# Patient Record
Sex: Female | Born: 1985 | Race: White | Hispanic: No | Marital: Single | Smoking: Never smoker
Health system: Southern US, Community
[De-identification: ages and names within clinical notes are randomized; demographics above are authoritative.]

## PROBLEM LIST (undated history)

## (undated) HISTORY — PX: TONSILLECTOMY: SUR1361

## (undated) HISTORY — PX: OTHER SURGICAL HISTORY: SHX169

---

## 2011-12-09 ENCOUNTER — Emergency Department (HOSPITAL_COMMUNITY)
Admission: EM | Admit: 2011-12-09 | Discharge: 2011-12-09 | Disposition: A | Discharge status: Home / Self Care | Payer: BC Managed Care – PPO | Attending: Emergency Medicine | Admitting: Emergency Medicine

## 2011-12-09 ENCOUNTER — Encounter (HOSPITAL_COMMUNITY): Payer: Self-pay | Admitting: Emergency Medicine

## 2011-12-09 DIAGNOSIS — X58XXXA Exposure to other specified factors, initial encounter: Secondary | ICD-10-CM | POA: Insufficient documentation

## 2011-12-09 DIAGNOSIS — T7840XA Allergy, unspecified, initial encounter: Secondary | ICD-10-CM | POA: Insufficient documentation

## 2011-12-09 DIAGNOSIS — R21 Rash and other nonspecific skin eruption: Secondary | ICD-10-CM | POA: Insufficient documentation

## 2011-12-09 MED ORDER — DIPHENHYDRAMINE HCL 50 MG/ML IJ SOLN
25.0000 mg | Freq: Once | INTRAMUSCULAR | Status: AC
  Administered 2011-12-09: 25 mg via INTRAVENOUS
  Filled 2011-12-09: qty 1

## 2011-12-09 MED ORDER — EPINEPHRINE 0.3 MG/0.3ML IJ DEVI: 0.3000 mg | Freq: Once | INTRAMUSCULAR | Status: DC

## 2011-12-09 MED ORDER — METHYLPREDNISOLONE SODIUM SUCC 125 MG IJ SOLR
125.0000 mg | Freq: Once | INTRAMUSCULAR | Status: AC
  Administered 2011-12-09: 125 mg via INTRAVENOUS
  Filled 2011-12-09: qty 2

## 2011-12-09 MED ORDER — FAMOTIDINE 40 MG PO TABS: 40.0000 mg | ORAL_TABLET | Freq: Two times a day (BID) | ORAL | Status: DC | PRN

## 2011-12-09 MED ORDER — DIPHENHYDRAMINE HCL 25 MG PO TABS: 25.0000 mg | ORAL_TABLET | Freq: Four times a day (QID) | ORAL | Status: DC

## 2011-12-09 MED ORDER — FAMOTIDINE IN NACL 20-0.9 MG/50ML-% IV SOLN
20.0000 mg | Freq: Once | INTRAVENOUS | Status: AC
  Administered 2011-12-09: 20 mg via INTRAVENOUS
  Filled 2011-12-09: qty 50

## 2011-12-09 MED ORDER — MUPIROCIN 2 % EX OINT: TOPICAL_OINTMENT | Freq: Two times a day (BID) | CUTANEOUS | Status: AC

## 2011-12-09 MED ORDER — SODIUM CHLORIDE 0.9 % IV BOLUS (SEPSIS)
1000.0000 mL | Freq: Once | INTRAVENOUS | Status: AC
  Administered 2011-12-09: 1000 mL via INTRAVENOUS

## 2011-12-09 MED ORDER — ALBUTEROL SULFATE (5 MG/ML) 0.5% IN NEBU
5.0000 mg | INHALATION_SOLUTION | Freq: Once | RESPIRATORY_TRACT | Status: AC
  Administered 2011-12-09: 5 mg via RESPIRATORY_TRACT
  Filled 2011-12-09: qty 1

## 2011-12-09 MED ORDER — PREDNISONE (PAK) 10 MG PO TABS: 10.0000 mg | ORAL_TABLET | Freq: Every day | ORAL | Status: AC

## 2011-12-09 NOTE — Discharge Instructions (Signed)
Allergic Reaction  Allergic reactions can be caused by anything your body is sensitive to. Your body may be sensitive to food, medicines, molds, pollens, cockroaches, dust mites, pets, insect stings, and other things around you. An allergic reaction may cause puffiness (swelling), itching, sneezing, coughing, or problems breathing.   Allergies cannot be cured, but they can be controlled with medicine. Some allergies happen only at certain times of the year. Try to stay away from what causes your reaction if possible. Sometimes, it is hard to tell what causes your reaction.  HOME CARE  If you have a rash or red patches (hives) on your skin:   Take medicines as told by your doctor.   Do not drive or drink alcohol after taking medicines. They can make you sleepy.   Put cold cloths on your skin. Take baths in cool water. This will help your itching. Do not take hot baths or showers. Heat will make the itching worse.   If your allergies get worse, your doctor might give you other medicines. Talk to your doctor if problems continue.  GET HELP RIGHT AWAY IF:    You have trouble breathing.   You have a tight feeling in your chest or throat.   Your mouth gets puffy (swollen).   You have red, itchy patches on your skin (hives) that get worse.   You have itching all over your body.  MAKE SURE YOU:    Understand these instructions.   Will watch your condition.   Will get help right away if you are not doing well or get worse.  Document Released: 07/17/2009 Document Revised: 07/18/2011 Document Reviewed: 07/17/2009  Exit[employer] Patient Information 2012 [employer].

## 2011-12-09 NOTE — ED Notes (Signed)
Pt states she went swimming in a pool at an apartment complex and when she got out she noticed she had red bumps all over and was itching  Pt states she took 4 benadryl and came to the hospital  Pt has circular places noted on her chest, shoulders, arms and on her upper legs  Pt states they itch  Pt states she was having difficulty breathing but the benadryl has helped with that

## 2011-12-09 NOTE — ED Provider Notes (Signed)
History     CSN: [id number]  Arrival date & time 12/09/11  1911   First MD Initiated Contact with Patient 12/09/11 2039      Chief Complaint  Patient presents with  . Allergic Reaction    (Consider location/radiation/quality/duration/timing/severity/associated sxs/prior treatment) HPI  26 year old female presents with chief complaints of allergic reaction. Patient was swimming in pool at her apartment complex, and when she got out she noticed red bumps all over herself.  She proceed to take 4 benadryl.  She also were having difficulty breathing and decided to come to ER.  Sts she has swim in same pool in the past without difficulty.  Denies fever, chills, medication changes, throat swelling, or abd pain.  Denies CP.  Does complain of nausea.  Denies new pets.    History reviewed. No pertinent past medical history.  Past Surgical History  Procedure Date  . Tonsillectomy   . Adnoidectomy      History reviewed. No pertinent family history.  History  Substance Use Topics  . Smoking status: Never Smoker   . Smokeless tobacco: Not on file  . Alcohol Use: No    OB History    Grav Para Term Preterm Abortions TAB SAB Ect Mult Living                  Review of Systems  All other systems reviewed and are negative.    Allergies  Augmentin and Ceclor  Home Medications   Current Outpatient Rx  Name Route Sig Dispense Refill  . DIPHENHYDRAMINE HCL 25 MG PO TABS Oral Take 50 mg by mouth 2 (two) times daily. Itching.      BP 125/66  Pulse 118  Temp(Src) 98.4 F (36.9 C) (Oral)  Resp 16  SpO2 100%  LMP 12/07/2011  Physical Exam  Nursing note and vitals reviewed. Constitutional: She appears well-developed and well-nourished. No distress.       Awake, alert, nontoxic appearance  HENT:  Head: Atraumatic.  Eyes: Conjunctivae are normal. Right eye exhibits no discharge. Left eye exhibits no discharge.  Neck: Neck supple.  Cardiovascular: Normal rate and regular  rhythm.   Pulmonary/Chest: Effort normal. No respiratory distress. She exhibits no tenderness.  Abdominal: Soft. There is no tenderness. There is no rebound.  Musculoskeletal: She exhibits no tenderness.       ROM appears intact, no obvious focal weakness  Neurological:       Mental status and motor strength appears intact  Skin: No rash noted.       0.5-1 cm circular rashes with central clearing noted on chest, arms, shoulders, back, thigh.  Non pustular, non petechiae.    Psychiatric: She has a normal mood and affect.    ED Course  Procedures (including critical care time)  Labs Reviewed - No data to display No results found.   No diagnosis found.    MDM  Pt with allergic rxn after swimming in pool.  However, her rash is suggestive of tinea corporis and not likely hives.  Pt in no acute resp distress, however does complain of intense itch. Sts benadryl has helped.    Plan to give NS, benadryl/pepcid/solumedrol/and albuterol nebs for treatment.     9:56 PM Pt felt better after treatment.  Will DC with prednisone/pepcid/benadryl.  Pt has epipen at home.  Due to her rash,will prescribe bactroban to use to prevent secondary bacterial infection.  Recommend f/u if worsen.  Pt voice understanding and agrees with plan.  Fayrene Helper, PA-C 12/09/11 2201

## 2011-12-10 NOTE — ED Provider Notes (Signed)
Medical screening examination/treatment/procedure(s) were performed by non-physician practitioner and as supervising physician I was immediately available for consultation/collaboration.   Ereka Brau, MD 12/10/11 0013 

## 2012-02-21 ENCOUNTER — Other Ambulatory Visit: Payer: Self-pay | Admitting: Family Medicine

## 2012-02-21 DIAGNOSIS — M545 Low back pain: Secondary | ICD-10-CM

## 2012-02-25 ENCOUNTER — Ambulatory Visit
Admission: RE | Admit: 2012-02-25 | Discharge: 2012-02-25 | Disposition: A | Discharge status: Home / Self Care | Payer: BC Managed Care – PPO | Source: Ambulatory Visit | Attending: Family Medicine | Admitting: Family Medicine

## 2012-02-25 DIAGNOSIS — M545 Low back pain: Secondary | ICD-10-CM

## 2013-06-14 ENCOUNTER — Emergency Department (HOSPITAL_COMMUNITY)
Admission: EM | Admit: 2013-06-14 | Discharge: 2013-06-14 | Disposition: A | Discharge status: Home / Self Care | Payer: BC Managed Care – PPO | Attending: Emergency Medicine | Admitting: Emergency Medicine

## 2013-06-14 DIAGNOSIS — R509 Fever, unspecified: Secondary | ICD-10-CM | POA: Diagnosis not present

## 2013-06-14 DIAGNOSIS — IMO0002 Reserved for concepts with insufficient information to code with codable children: Secondary | ICD-10-CM

## 2013-06-14 DIAGNOSIS — L0231 Cutaneous abscess of buttock: Secondary | ICD-10-CM | POA: Insufficient documentation

## 2013-06-14 DIAGNOSIS — R1110 Vomiting, unspecified: Secondary | ICD-10-CM | POA: Diagnosis not present

## 2013-06-14 MED ORDER — PROMETHAZINE HCL 25 MG PO TABS: 25.0000 mg | ORAL_TABLET | Freq: Four times a day (QID) | ORAL | Status: DC | PRN

## 2013-06-14 MED ORDER — KETOROLAC TROMETHAMINE 60 MG/2ML IM SOLN
30.0000 mg | Freq: Once | INTRAMUSCULAR | Status: AC
  Administered 2013-06-14: 30 mg via INTRAMUSCULAR
  Filled 2013-06-14: qty 2

## 2013-06-14 MED ORDER — OXYCODONE-ACETAMINOPHEN 5-325 MG PO TABS: ORAL_TABLET | ORAL | Status: DC

## 2013-06-14 MED ORDER — SULFAMETHOXAZOLE-TMP DS 800-160 MG PO TABS
1.0000 | ORAL_TABLET | Freq: Once | ORAL | Status: AC
  Administered 2013-06-14: 1 via ORAL
  Filled 2013-06-14: qty 1

## 2013-06-14 MED ORDER — SULFAMETHOXAZOLE-TRIMETHOPRIM 800-160 MG PO TABS: 1.0000 | ORAL_TABLET | Freq: Two times a day (BID) | ORAL | Status: DC

## 2013-06-14 MED ORDER — ONDANSETRON 4 MG PO TBDP
4.0000 mg | ORAL_TABLET | Freq: Once | ORAL | Status: AC
  Administered 2013-06-14: 4 mg via ORAL
  Filled 2013-06-14: qty 1

## 2013-06-14 NOTE — ED Provider Notes (Signed)
CSN: [id number]     Arrival date & time 06/14/13  1755 History   First MD Initiated Contact with Patient 06/14/13 2015     Chief Complaint  Patient presents with  . Skin Abcess   . Emesis   (Consider location/radiation/quality/duration/timing/severity/associated sxs/prior Treatment) HPI  Diane House is a 27 y.o. female complaining of abscess to right gluteus is actively draining blood and pus. Patient noticed a swelling approximately 3 days ago. She has had 5 episodes of emesis today. She also reports subjective fever and chills. Patient took ibuprofen at 4:30 PM. Patient denies prior history of abscess.  No past medical history on file. Past Surgical History  Procedure Laterality Date  . Tonsillectomy    . Adnoidectomy      No family history on file. History  Substance Use Topics  . Smoking status: Never Smoker   . Smokeless tobacco: Not on file  . Alcohol Use: No   OB History   Grav Para Term Preterm Abortions TAB SAB Ect Mult Living                 Review of Systems 10 systems reviewed and found to be negative, except as noted in the HPI   Allergies  Augmentin and Ceclor  Home Medications   Current Outpatient Rx  Name  Route  Sig  Dispense  Refill  . ibuprofen (ADVIL,MOTRIN) 200 MG tablet   Oral   Take 400 mg by mouth every 6 (six) hours as needed for pain.         Marland Kitchen oxyCODONE-acetaminophen (PERCOCET/ROXICET) 5-325 MG per tablet      1 to 2 tabs PO q6hrs  PRN for pain   15 tablet   0   . promethazine (PHENERGAN) 25 MG tablet   Oral   Take 1 tablet (25 mg total) by mouth every 6 (six) hours as needed for nausea.   12 tablet   0   . sulfamethoxazole-trimethoprim (SEPTRA DS) 800-160 MG per tablet   Oral   Take 1 tablet by mouth every 12 (twelve) hours.   14 tablet   0    BP 139/84  Pulse 91  Temp(Src) 99 F (37.2 C) (Oral)  SpO2 99%  LMP 05/29/2013 Physical Exam  Nursing note and vitals reviewed. Constitutional: She is oriented to person,  place, and time. She appears well-developed and well-nourished. No distress.  HENT:  Head: Normocephalic.  Eyes: Conjunctivae and EOM are normal.  Cardiovascular: Normal rate.   Pulmonary/Chest: Effort normal. No stridor.  Musculoskeletal: Normal range of motion.  Neurological: She is alert and oriented to person, place, and time.  Skin:     Psychiatric: She has a normal mood and affect.    ED Course  Procedures (including critical care time)  INCISION AND DRAINAGE Performed by: Wynetta Emery Consent: Verbal consent obtained. Risks and benefits: risks, benefits and alternatives were discussed Type: abscess  Body area: Right gluteus  Anesthesia: local infiltration  Incision was made with a scalpel.  Local anesthetic: lidocaine 2% with epinephrine  Anesthetic total: 3 ml  Complexity: complex Blunt dissection to break up loculations  Drainage: purulent  Drainage amount: 5ml  Packing material: none  Patient tolerance: Patient tolerated the procedure well with no immediate complications.   Labs Review Labs Reviewed - No data to display Imaging Review No results found.  EKG Interpretation   None       MDM   1. Abscess of cellulitis of buttock    Filed Vitals:  06/14/13 1818 06/14/13 2129  BP: 139/84 120/83  Pulse: 91 91  Temp: 99 F (37.2 C) 98 F (36.7 C)  TempSrc: Oral Oral  Resp:  16  SpO2: 99% 98%     Diane House is a 27 y.o. female with right gluteal abscess, and surrounding cellulitis. Abscess is approximately a 6 cm away from the anal verge it off think there is danger of connecting track to the rectum. I&D yielded a scant amount of purulent material. I think is reasonable start her on antibiotics because of the surrounding cellulitis and small amount of purulent drainage. Patient will be advised to perform sitz baths. Extensive discussion of return precautions in light of subjective fever and chills and vomiting.   Medications    ketorolac (TORADOL) injection 30 mg (30 mg Intramuscular Given 06/14/13 2043)  ondansetron (ZOFRAN-ODT) disintegrating tablet 4 mg (4 mg Oral Given 06/14/13 2044)  sulfamethoxazole-trimethoprim (BACTRIM DS) 800-160 MG per tablet 1 tablet (1 tablet Oral Given 06/14/13 2126)    Pt is hemodynamically stable, appropriate for, and amenable to discharge at this time. Pt verbalized understanding and agrees with care plan. All questions answered. Outpatient follow-up and specific return precautions discussed.    Discharge Medication List as of 06/14/2013  9:09 PM    START taking these medications   Details  oxyCODONE-acetaminophen (PERCOCET/ROXICET) 5-325 MG per tablet 1 to 2 tabs PO q6hrs  PRN for pain, Print    promethazine (PHENERGAN) 25 MG tablet Take 1 tablet (25 mg total) by mouth every 6 (six) hours as needed for nausea., Starting 06/14/2013, Until Discontinued, Print    sulfamethoxazole-trimethoprim (SEPTRA DS) 800-160 MG per tablet Take 1 tablet by mouth every 12 (twelve) hours., Starting 06/14/2013, Until Discontinued, Print        Note: Portions of this report may have been transcribed using voice recognition software. Every effort was made to ensure accuracy; however, inadvertent computerized transcription errors may be present      Wynetta Emery, PA-C 06/15/13 0045

## 2013-06-14 NOTE — ED Notes (Addendum)
Pt states she has what seems to be an abcess on her R buttock that ruptured today and is now draining. States she has been vomiting and has been hot and cold . Took ibuprofen at 4:30pm.

## 2013-06-15 NOTE — ED Provider Notes (Signed)
Medical screening examination/treatment/procedure(s) were performed by non-physician practitioner and as supervising physician I was immediately available for consultation/collaboration.   Derwood Kaplan, MD 06/15/13 [phone]

## 2013-10-25 ENCOUNTER — Encounter (HOSPITAL_COMMUNITY): Payer: Self-pay | Admitting: Emergency Medicine

## 2013-10-25 ENCOUNTER — Emergency Department (HOSPITAL_COMMUNITY)
Admission: EM | Admit: 2013-10-25 | Discharge: 2013-10-25 | Disposition: A | Discharge status: Home / Self Care | Payer: BC Managed Care – PPO | Attending: Emergency Medicine | Admitting: Emergency Medicine

## 2013-10-25 DIAGNOSIS — Z792 Long term (current) use of antibiotics: Secondary | ICD-10-CM | POA: Insufficient documentation

## 2013-10-25 DIAGNOSIS — R21 Rash and other nonspecific skin eruption: Secondary | ICD-10-CM | POA: Insufficient documentation

## 2013-10-25 DIAGNOSIS — Z880 Allergy status to penicillin: Secondary | ICD-10-CM | POA: Insufficient documentation

## 2013-10-25 DIAGNOSIS — L02415 Cutaneous abscess of right lower limb: Secondary | ICD-10-CM

## 2013-10-25 DIAGNOSIS — L02416 Cutaneous abscess of left lower limb: Secondary | ICD-10-CM

## 2013-10-25 DIAGNOSIS — L02419 Cutaneous abscess of limb, unspecified: Secondary | ICD-10-CM | POA: Insufficient documentation

## 2013-10-25 DIAGNOSIS — L03119 Cellulitis of unspecified part of limb: Principal | ICD-10-CM

## 2013-10-25 DIAGNOSIS — Z79899 Other long term (current) drug therapy: Secondary | ICD-10-CM | POA: Insufficient documentation

## 2013-10-25 MED ORDER — CLINDAMYCIN HCL 150 MG PO CAPS: 150.0000 mg | ORAL_CAPSULE | Freq: Two times a day (BID) | ORAL | Status: AC

## 2013-10-25 NOTE — Discharge Instructions (Signed)
 Abscess Care After An abscess (also called a boil or furuncle) is an infected area that contains a collection of pus. Signs and symptoms of an abscess include pain, tenderness, redness, or hardness, or you may feel a moveable soft area under your skin. An abscess can occur anywhere in the body. The infection may spread to surrounding tissues causing cellulitis. A cut (incision) by the surgeon was made over your abscess and the pus was drained out. Gauze may have been packed into the space to provide a drain that will allow the cavity to heal from the inside outwards. The boil may be painful for 5 to 7 days. Most people with a boil do not have high fevers. Your abscess, if seen early, may not have localized, and may not have been lanced. If not, another appointment may be required for this if it does not get better on its own or with medications. HOME CARE INSTRUCTIONS   Only take over-the-counter or prescription medicines for pain, discomfort, or fever as directed by your caregiver.  When you bathe, soak and then remove gauze or iodoform packs at least daily or as directed by your caregiver. You may then wash the wound gently with mild soapy water. Repack with gauze or do as your caregiver directs. SEEK IMMEDIATE MEDICAL CARE IF:   You develop increased pain, swelling, redness, drainage, or bleeding in the wound site.  You develop signs of generalized infection including muscle aches, chills, fever, or a general ill feeling.  An oral temperature above 102 F (38.9 C) develops, not controlled by medication. See your caregiver for a recheck if you develop any of the symptoms described above. If medications (antibiotics) were prescribed, take them as directed. Document Released: 02/14/2005 Document Revised: 10/21/2011 Document Reviewed: 10/12/2007 Exit[employer] Patient Information 2014 [employer].   Abscess An abscess is an infected area that contains a collection of pus and debris.It can  occur in almost any part of the body. An abscess is also known as a furuncle or boil. CAUSES  An abscess occurs when tissue gets infected. This can occur from blockage of oil or sweat glands, infection of hair follicles, or a minor injury to the skin. As the body tries to fight the infection, pus collects in the area and creates pressure under the skin. This pressure causes pain. People with weakened immune systems have difficulty fighting infections and get certain abscesses more often.  SYMPTOMS Usually an abscess develops on the skin and becomes a painful mass that is red, warm, and tender. If the abscess forms under the skin, you may feel a moveable soft area under the skin. Some abscesses break open (rupture) on their own, but most will continue to get worse without care. The infection can spread deeper into the body and eventually into the bloodstream, causing you to feel ill.  DIAGNOSIS  Your caregiver will take your medical history and perform a physical exam. A sample of fluid may also be taken from the abscess to determine what is causing your infection. TREATMENT  Your caregiver may prescribe antibiotic medicines to fight the infection. However, taking antibiotics alone usually does not cure an abscess. Your caregiver may need to make a small cut (incision) in the abscess to drain the pus. In some cases, gauze is packed into the abscess to reduce pain and to continue draining the area. HOME CARE INSTRUCTIONS   Only take over-the-counter or prescription medicines for pain, discomfort, or fever as directed by your caregiver.  If   you were prescribed antibiotics, take them as directed. Finish them even if you start to feel better.  If gauze is used, follow your caregiver's directions for changing the gauze.  To avoid spreading the infection:  Keep your draining abscess covered with a bandage.  Wash your hands well.  Do not share personal care items, towels, or whirlpools with  others.  Avoid skin contact with others.  Keep your skin and clothes clean around the abscess.  Keep all follow-up appointments as directed by your caregiver. SEEK MEDICAL CARE IF:   You have increased pain, swelling, redness, fluid drainage, or bleeding.  You have muscle aches, chills, or a general ill feeling.  You have a fever. MAKE SURE YOU:   Understand these instructions.  Will watch your condition.  Will get help right away if you are not doing well or get worse. Document Released: 05/08/2005 Document Revised: 01/28/2012 Document Reviewed: 10/11/2011 [employer] Patient Information 2014 [employer].  

## 2013-10-25 NOTE — ED Provider Notes (Signed)
CSN: [id number]     Arrival date & time 10/25/13  1412 History   First MD Initiated Contact with Patient 10/25/13 1913     Chief Complaint  Patient presents with  . Abscess     (Consider location/radiation/quality/duration/timing/severity/associated sxs/prior Treatment) HPI   Patient to the ER with complaints of multiple abscesses. These abscesses started 1 month ago to left upper leg after she sustained a laceration. Since then she has been given 1 round of abx but the abscesses continue to come and she is allergic to Augmentin and Amoxicillin. She drained abscesses herself at home before coming to the ED because she did not want [place] to cut into them and leave more scars. The patient is crying and distressed because she is upset about scaring and that for the past month the abscesses just keep proliferating.   History reviewed. No pertinent past medical history. Past Surgical History  Procedure Laterality Date  . Tonsillectomy    . Adnoidectomy      No family history on file. History  Substance Use Topics  . Smoking status: Never Smoker   . Smokeless tobacco: Not on file  . Alcohol Use: No   OB History   Grav Para Term Preterm Abortions TAB SAB Ect Mult Living                 Review of Systems  The patient denies anorexia, fever, weight loss,, vision loss, decreased hearing, hoarseness, chest pain, syncope, dyspnea on exertion, peripheral edema, balance deficits, hemoptysis, abdominal pain, melena, hematochezia, severe indigestion/heartburn, hematuria, incontinence, genital sores, muscle weakness, suspicious skin lesions, transient blindness, difficulty walking, depression, unusual weight change, abnormal bleeding, enlarged lymph nodes, angioedema, and breast masses.    Allergies  Amoxicillin; Augmentin; and Ceclor  Home Medications   Current Outpatient Rx  Name  Route  Sig  Dispense  Refill  . ranitidine (ZANTAC) 150 MG tablet   Oral   Take 150 mg by mouth 2 (two)  times daily.         . clindamycin (CLEOCIN) 150 MG capsule   Oral   Take 1 capsule (150 mg total) by mouth 2 (two) times daily.   60 capsule   0    BP 132/76  Pulse 95  Temp(Src) 99.1 F (37.3 C) (Oral)  Resp 18  SpO2 100%  LMP 10/22/2013 Physical Exam  Nursing note and vitals reviewed. Constitutional: She appears well-developed and well-nourished. No distress.  HENT:  Head: Normocephalic and atraumatic.  Eyes: Pupils are equal, round, and reactive to light.  Neck: Normal range of motion. Neck supple.  Cardiovascular: Normal rate and regular rhythm.   Pulmonary/Chest: Effort normal.  Abdominal: Soft.  Neurological: She is alert.  Skin: Skin is warm and dry. Rash noted.       ED Course  Procedures (including critical care time) Labs Review Labs Reviewed - No data to display Imaging Review No results found.   EKG Interpretation None      MDM   Final diagnoses:  Multiple abscesses of both legs    Patient has medication allergies and has failed treatment already. Her skin has been colonized with bacteria after her injury and the abscess are spreading, she reports before dc that she is now getting them under her arms. Will give long course of low dose Clindamycin to  Eradicate infection.  28 y.o.Lady Kapusta's evaluation in the Emergency Department is complete. It has been determined that no acute conditions requiring further emergency intervention are present  at this time. The patient/guardian have been advised of the diagnosis and plan. We have discussed signs and symptoms that warrant return to the ED, such as changes or worsening in symptoms.  Vital signs are stable at discharge. Filed Vitals:   10/25/13 1623  BP: 132/76  Pulse: 95  Temp: 99.1 F (37.3 C)  Resp: 18    Patient/guardian has voiced understanding and agreed to follow-up with the PCP or specialist.      Dorthula Matasiffany G Cherri Yera, PA-C 10/25/13 1943

## 2013-10-25 NOTE — ED Notes (Signed)
Pt seen by PA. Pt in NAD. Appropriate for discharge. Denies pain

## 2013-10-25 NOTE — ED Notes (Addendum)
Pt has multiple small areas of abscess to left left upper leg and buttocks. Also single abscess under arm. States these started last month after she had a laceration to her left upper leg. Reports pain and frustration. Areas covered in bandaids. Drained last week.

## 2013-10-29 NOTE — ED Provider Notes (Signed)
Medical screening examination/treatment/procedure(s) were performed by non-physician practitioner and as supervising physician I was immediately available for consultation/collaboration.   EKG Interpretation None        Shaelin Lalley M Nayleen Janosik, DO 10/29/13 1436 

## 2015-03-29 ENCOUNTER — Telehealth: Payer: Self-pay

## 2015-03-29 NOTE — Telephone Encounter (Signed)
Received phone call from patient at this time. Pt states " My spleen is enlarged and I need to be seen." Upon asking further questions, she informs me that she has a history of Splenomegaly in 10/2013 while living in [place] and all of this is documented at her hospital in [place]. 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 She does not have an established PCP in the area at this time. I also asked patient how she knew her spleen was currently enlarged and she states that she can feel it and see it through her skin. She denies pain but states that she just knows that it is enlarged because "it's the biggest it's ever been." Explained to patient that I would have to speak to our surgeon on call today and I would give her a return phone call.  Spoke with Dr. Tonita Cong at this time and explained the situation. Upon reviewing the EPIC chart that the patient has and hearing the story, he would like patient to set up PCP and have referral sent or have all records from [place] available for review prior to seeing patient in office. If patient has significant pain or becomes more symptomatic prior to being seen by PCP, she needs to be seen in Emergency Department.   Called patient back at this time and explained what the surgeon and I had discussed. She verbalizes understanding of this and will get a PCP as soon as possible.

## 2015-04-03 ENCOUNTER — Other Ambulatory Visit: Payer: Self-pay | Admitting: Family Medicine

## 2015-04-03 ENCOUNTER — Other Ambulatory Visit: Payer: Self-pay | Admitting: Adult Health

## 2015-04-03 DIAGNOSIS — R1084 Generalized abdominal pain: Secondary | ICD-10-CM

## 2015-04-03 DIAGNOSIS — R161 Splenomegaly, not elsewhere classified: Secondary | ICD-10-CM

## 2015-04-05 ENCOUNTER — Ambulatory Visit: Payer: 59

## 2015-04-21 ENCOUNTER — Ambulatory Visit
Admission: RE | Admit: 2015-04-21 | Discharge: 2015-04-21 | Disposition: A | Discharge status: Home / Self Care | Payer: 59 | Source: Ambulatory Visit | Attending: Family Medicine | Admitting: Family Medicine

## 2015-04-21 DIAGNOSIS — R1084 Generalized abdominal pain: Secondary | ICD-10-CM | POA: Insufficient documentation

## 2015-04-21 DIAGNOSIS — R161 Splenomegaly, not elsewhere classified: Secondary | ICD-10-CM

## 2016-07-04 IMAGING — US US ABDOMEN COMPLETE
1 series · 14 of 25 positions shown · non-contrast
Comparison: None in PACs

CLINICAL DATA: Generalized upper abdominal pressure and discomfort,
history of splenomegaly

EXAM:
ULTRASOUND ABDOMEN COMPLETE

[Series 1: us abdomen complete · 0.23mm/px · 14 of 77 slices shown]
[im 1/77]
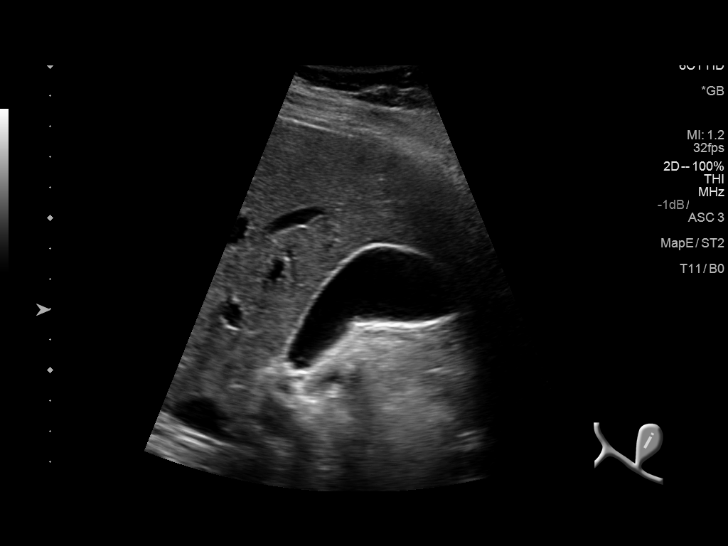
[im 7/77]
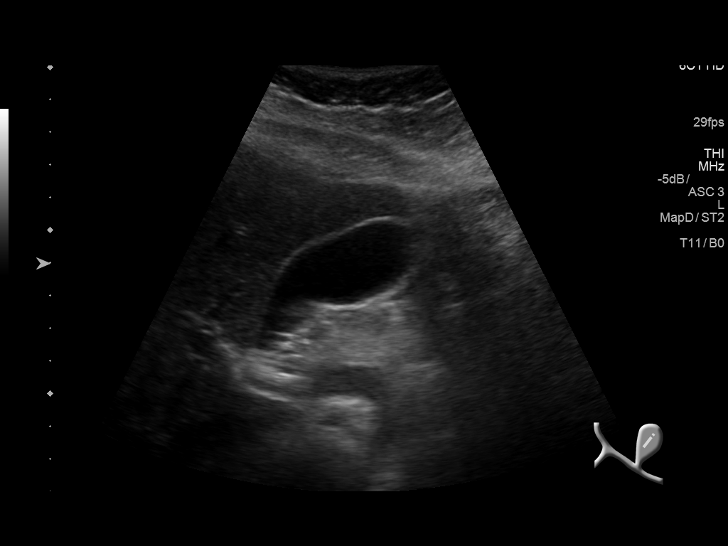
[im 13/77]
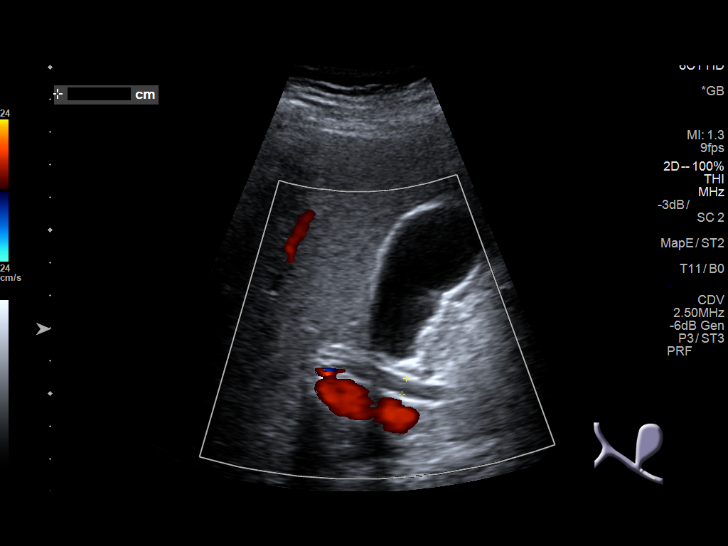
[im 20/77]
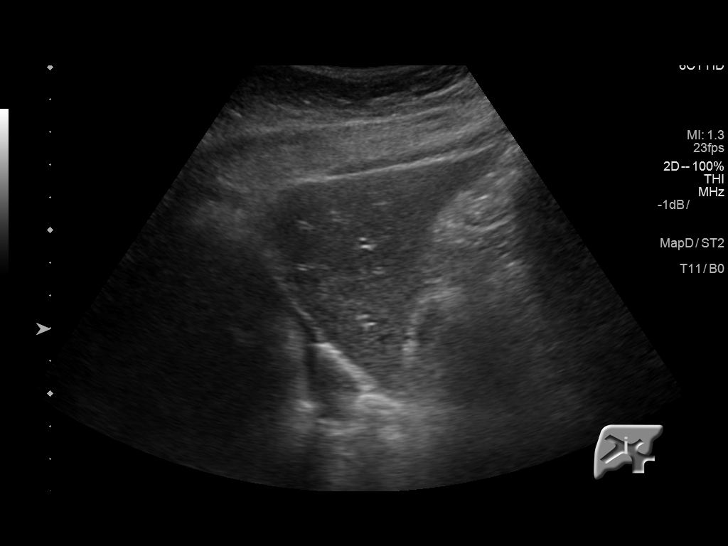
[im 26/77]
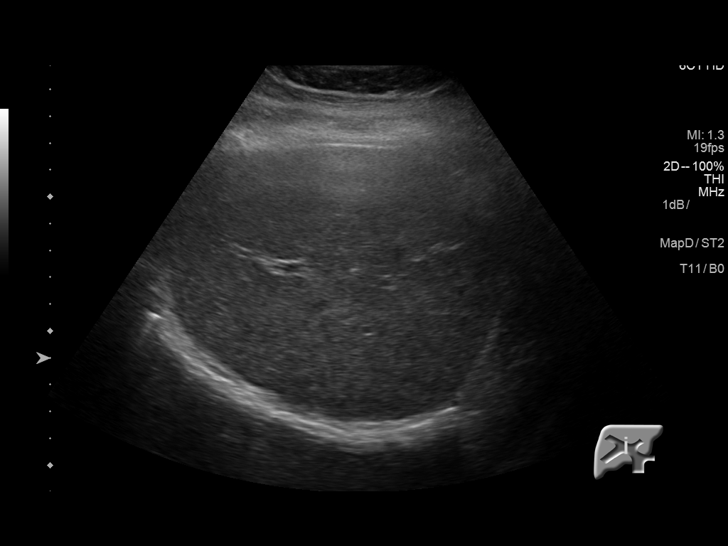
[im 29/77]
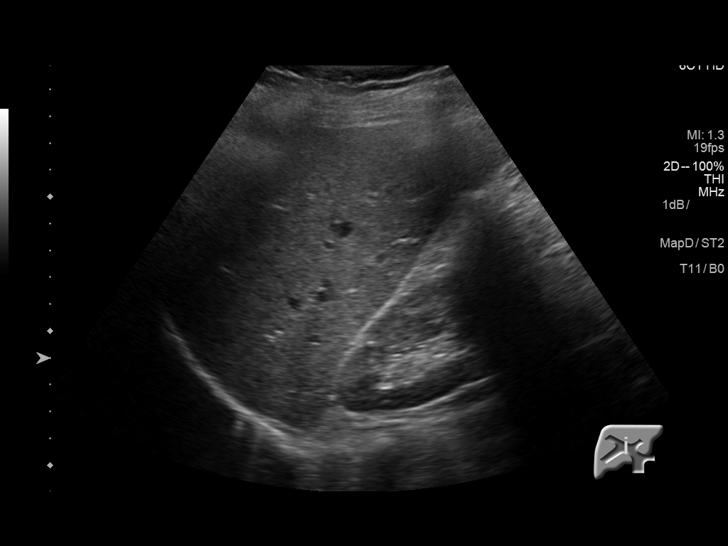
[im 35/77]
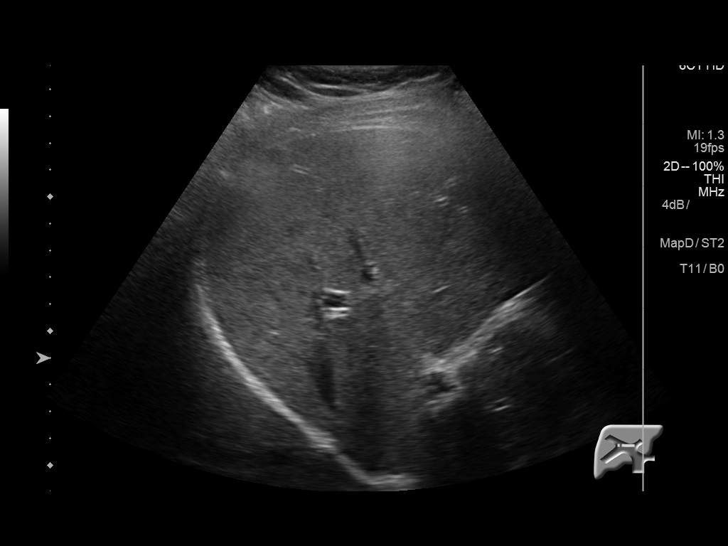
[im 42/77]
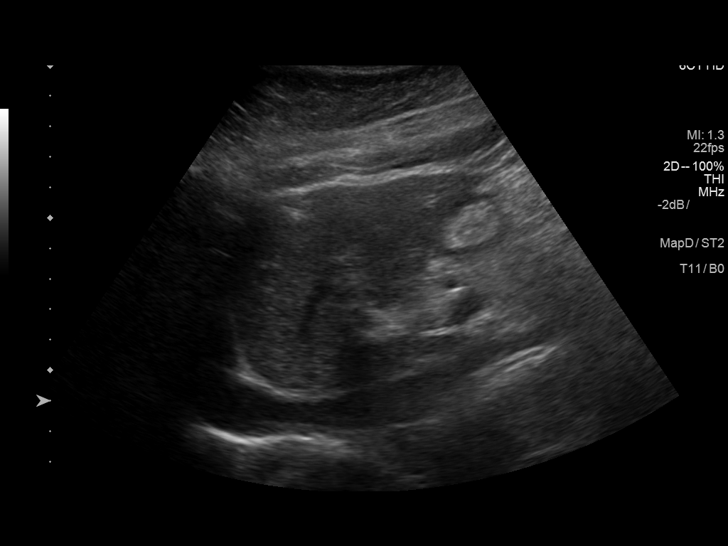
[im 48/77]
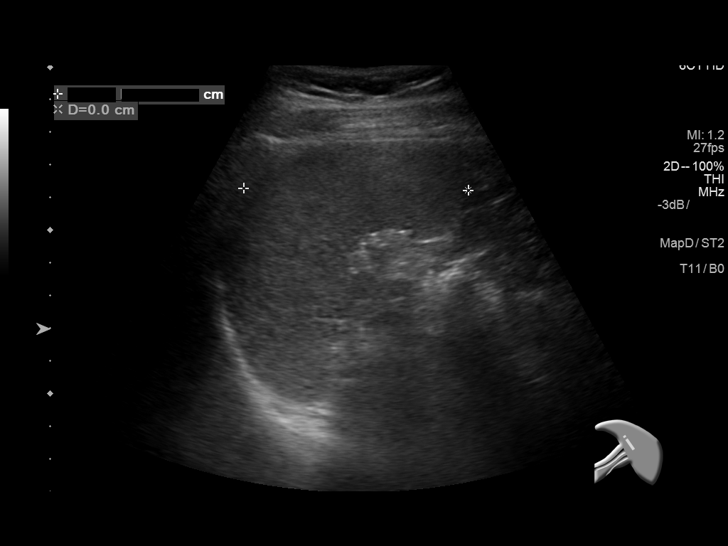
[im 51/77]
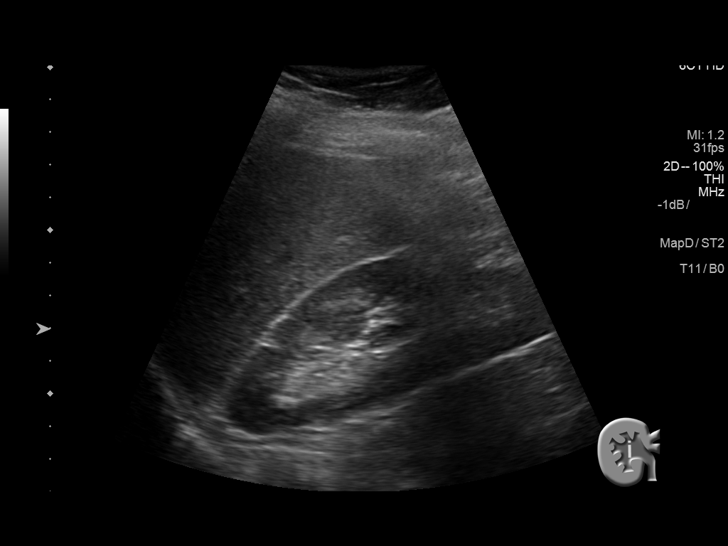
[im 58/77]
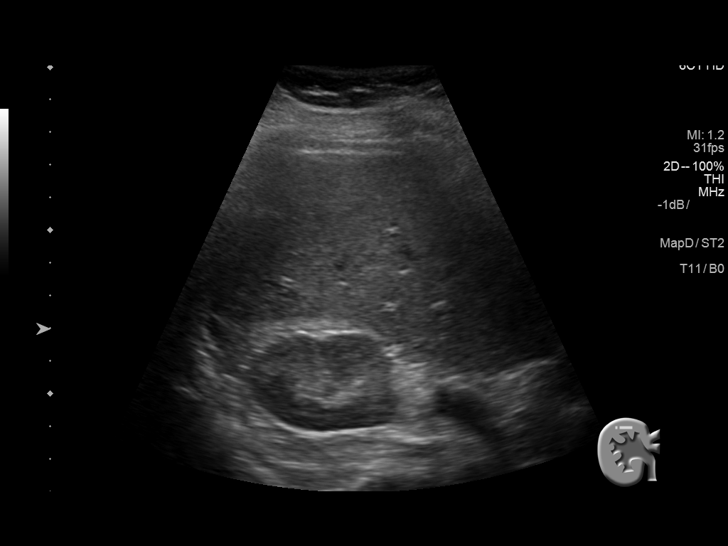
[im 64/77]
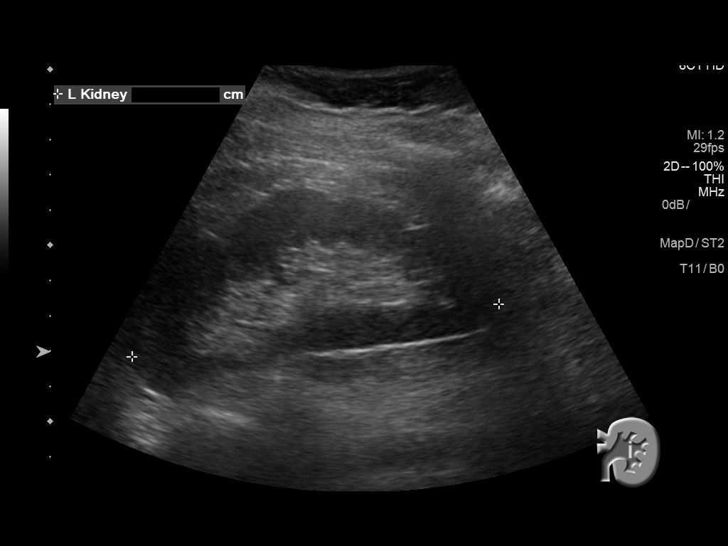
[im 70/77]
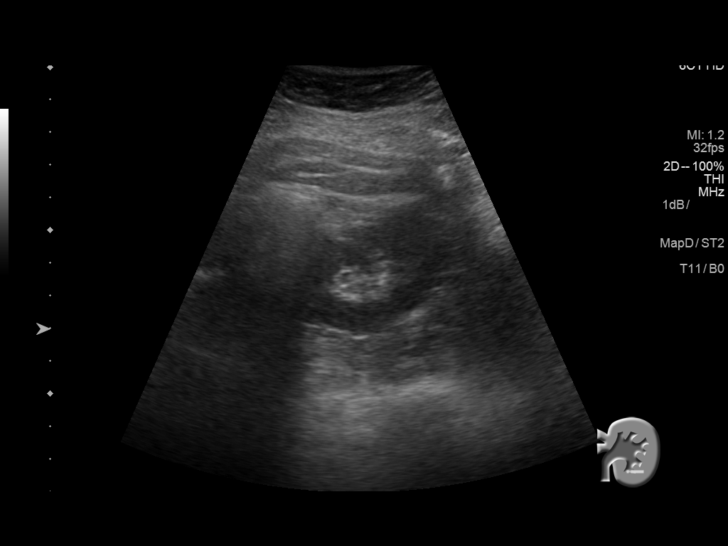
[im 77/77]
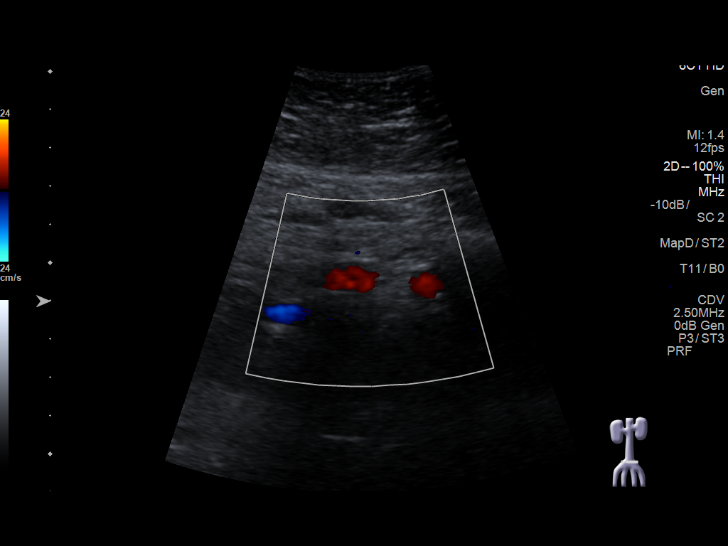

[14 of 25 positions shown; findings below may reference images not displayed]

FINDINGS: Gallbladder: No gallstones or wall thickening visualized. No
sonographic Murphy sign noted.

Common bile duct: Diameter: 5 mm

Liver: No focal lesion identified. Within normal limits in
parenchymal echogenicity.

IVC: No abnormality visualized.

Pancreas: Evaluation of the pancreatic tail was limited due to bowel
gas.

Spleen: The spleen is normal in echotexture and size measuring
cm in length.

Right Kidney: Length: 11.1 cm. Echogenicity within normal limits. No
mass or hydronephrosis visualized.

Left Kidney: Length: 10.5 cm. Echogenicity within normal limits. No
mass or hydronephrosis visualized.

Abdominal aorta: No aneurysm visualized.

Other findings: There is no ascites.
IMPRESSION: 1. The spleen is normal in size and echotexture. The pancreatic tail
was obscured by bowel gas.
2. There is no acute hepatobiliary abnormality.
3. The kidneys are unremarkable.

## 2021-02-21 ENCOUNTER — Ambulatory Visit (HOSPITAL_COMMUNITY): Payer: Self-pay | Admitting: Licensed Clinical Social Worker

## 2021-03-14 ENCOUNTER — Ambulatory Visit (HOSPITAL_COMMUNITY): Payer: Self-pay | Admitting: Licensed Clinical Social Worker
